# Patient Record
Sex: Male | Born: 1970 | Race: White | Marital: Single | Smoking: Heavy tobacco smoker
Health system: Southern US, Community
[De-identification: ages and names within clinical notes are randomized; demographics above are authoritative.]

## PROBLEM LIST (undated history)

## (undated) HISTORY — PX: HERNIA REPAIR: SHX51

---

## 2016-05-30 ENCOUNTER — Emergency Department: Payer: Self-pay

## 2016-05-30 ENCOUNTER — Emergency Department
Admission: EM | Admit: 2016-05-30 | Discharge: 2016-05-30 | Disposition: A | Discharge status: Home / Self Care | Payer: Self-pay | Attending: Emergency Medicine | Admitting: Emergency Medicine

## 2016-05-30 ENCOUNTER — Other Ambulatory Visit: Payer: Self-pay | Admitting: Emergency Medicine

## 2016-05-30 ENCOUNTER — Ambulatory Visit
Admission: RE | Admit: 2016-05-30 | Discharge: 2016-05-30 | Disposition: A | Discharge status: Home / Self Care | Payer: Self-pay | Source: Ambulatory Visit | Attending: Emergency Medicine | Admitting: Emergency Medicine

## 2016-05-30 DIAGNOSIS — R1011 Right upper quadrant pain: Secondary | ICD-10-CM | POA: Insufficient documentation

## 2016-05-30 DIAGNOSIS — R1084 Generalized abdominal pain: Secondary | ICD-10-CM

## 2016-05-30 DIAGNOSIS — R110 Nausea: Secondary | ICD-10-CM | POA: Insufficient documentation

## 2016-05-30 DIAGNOSIS — F17210 Nicotine dependence, cigarettes, uncomplicated: Secondary | ICD-10-CM | POA: Insufficient documentation

## 2016-05-30 LAB — COMPREHENSIVE METABOLIC PANEL
ALT: 13 U/L — ABNORMAL LOW (ref 17–63)
ANION GAP: 6 (ref 5–15)
AST: 20 U/L (ref 15–41)
Albumin: 4.5 g/dL (ref 3.5–5.0)
Alkaline Phosphatase: 33 U/L — ABNORMAL LOW (ref 38–126)
BILIRUBIN TOTAL: 0.5 mg/dL (ref 0.3–1.2)
BUN: 19 mg/dL (ref 6–20)
CO2: 26 mmol/L (ref 22–32)
Calcium: 8.7 mg/dL — ABNORMAL LOW (ref 8.9–10.3)
Chloride: 105 mmol/L (ref 101–111)
Creatinine, Ser: 1.14 mg/dL (ref 0.61–1.24)
Glucose, Bld: 102 mg/dL — ABNORMAL HIGH (ref 65–99)
POTASSIUM: 4.2 mmol/L (ref 3.5–5.1)
Sodium: 137 mmol/L (ref 135–145)
Total Protein: 6.8 g/dL (ref 6.5–8.1)

## 2016-05-30 LAB — CBC
HEMATOCRIT: 40.3 % (ref 40.0–52.0)
HEMOGLOBIN: 13.8 g/dL (ref 13.0–18.0)
MCH: 32.3 pg (ref 26.0–34.0)
MCHC: 34.2 g/dL (ref 32.0–36.0)
MCV: 94.6 fL (ref 80.0–100.0)
Platelets: 186 10*3/uL (ref 150–440)
RBC: 4.27 MIL/uL — ABNORMAL LOW (ref 4.40–5.90)
RDW: 13 % (ref 11.5–14.5)
WBC: 6.7 10*3/uL (ref 3.8–10.6)

## 2016-05-30 LAB — URINALYSIS, COMPLETE (UACMP) WITH MICROSCOPIC
Bacteria, UA: NONE SEEN
Bilirubin Urine: NEGATIVE
Glucose, UA: NEGATIVE mg/dL
Hgb urine dipstick: NEGATIVE
KETONES UR: NEGATIVE mg/dL
Leukocytes, UA: NEGATIVE
Nitrite: NEGATIVE
PROTEIN: NEGATIVE mg/dL
RBC / HPF: NONE SEEN RBC/hpf (ref 0–5)
Specific Gravity, Urine: 1.013 (ref 1.005–1.030)
Squamous Epithelial / LPF: NONE SEEN
WBC UA: NONE SEEN WBC/hpf (ref 0–5)
pH: 6 (ref 5.0–8.0)

## 2016-05-30 LAB — LIPASE, BLOOD: Lipase: 22 U/L (ref 11–51)

## 2016-05-30 MED ORDER — MORPHINE SULFATE (PF) 4 MG/ML IV SOLN
INTRAVENOUS | Status: AC
  Administered 2016-05-30: 4 mg via INTRAVENOUS
  Filled 2016-05-30: qty 1

## 2016-05-30 MED ORDER — IOPAMIDOL (ISOVUE-300) INJECTION 61%: 30.0000 mL | Freq: Once | INTRAVENOUS | Status: DC | PRN

## 2016-05-30 MED ORDER — GI COCKTAIL ~~LOC~~: 30.0000 mL | Freq: Once | ORAL | Status: DC

## 2016-05-30 MED ORDER — ONDANSETRON HCL 4 MG/2ML IJ SOLN
4.0000 mg | Freq: Once | INTRAMUSCULAR | Status: AC | PRN
  Administered 2016-05-30: 4 mg via INTRAVENOUS
  Filled 2016-05-30: qty 2

## 2016-05-30 MED ORDER — SODIUM CHLORIDE 0.9 % IV BOLUS (SEPSIS)
1000.0000 mL | Freq: Once | INTRAVENOUS | Status: AC
  Administered 2016-05-30: 1000 mL via INTRAVENOUS

## 2016-05-30 MED ORDER — MORPHINE SULFATE (PF) 4 MG/ML IV SOLN
4.0000 mg | Freq: Once | INTRAVENOUS | Status: AC
  Administered 2016-05-30: 4 mg via INTRAVENOUS

## 2016-05-30 MED ORDER — MORPHINE SULFATE (PF) 4 MG/ML IV SOLN
4.0000 mg | Freq: Once | INTRAVENOUS | Status: AC
  Administered 2016-05-30: 4 mg via INTRAVENOUS
  Filled 2016-05-30: qty 1

## 2016-05-30 NOTE — ED Triage Notes (Signed)
Pt presents c/o RUQ pain starting this am. + nausea.

## 2016-05-30 NOTE — ED Provider Notes (Signed)
[facility] Emergency Department Provider Note ____________________________________________   I have reviewed the triage vital signs and the triage nursing note.  HISTORY  Chief Complaint Abdominal Pain   Historian Patient  HPI Alvin Gentry is a 46 y.o. male presents with moderate to severe right upper quadrant pain with nausea but no vomiting. No lower abdominal pain. History of bilateral inguinal hernia repair, but no other intra-abdominal surgeries. No constipation or diarrhea. Never has had this pain before pain is essentially constant for several hours now since early this morning.  Chest pain or trouble breathing.    History reviewed. No pertinent past medical history.  There are no active problems to display for this patient.   Past Surgical History:  Procedure Laterality Date  . HERNIA REPAIR     groin    Prior to Admission medications   Not on File    Allergies  Allergen Reactions  . Asa [Aspirin] Anaphylaxis  . Tetracyclines & Related     History reviewed. No pertinent family history.  Social History Social History  Substance Use Topics  . Smoking status: Heavy Tobacco Smoker    Packs/day: 1.00    Types: Cigarettes  . Smokeless tobacco: Never Used  . Alcohol use No    Review of Systems  Constitutional: Negative for fever. Eyes: Negative for visual changes. ENT: Negative for sore throat. Cardiovascular: Negative for chest pain. Respiratory: Negative for shortness of breath. Gastrointestinal: As per history of present illness.. Genitourinary: Negative for dysuria. Musculoskeletal: Negative for back pain. Skin: Negative for rash. Neurological: Negative for headache. 10 point Review of Systems otherwise negative ____________________________________________   PHYSICAL EXAM:  VITAL SIGNS: ED Triage Vitals  Enc Vitals Group     BP 05/30/16 1009 (!) 123/109     Pulse Rate 05/30/16 1009 89     Resp 05/30/16 1009 16      Temp 05/30/16 1009 98 F (36.7 C)     Temp Source 05/30/16 1009 Oral     SpO2 05/30/16 1009 99 %     Weight 05/30/16 1010 165 lb (74.8 kg)     Height 05/30/16 1010  (1.905 m)     Head Circumference --      Peak Flow --      Pain Score 05/30/16 1009 8     Pain Loc --      Pain Edu? --      Excl. in GC? --      Constitutional: Alert and oriented. Holding right upper quadrant with hand. HEENT   Head: Normocephalic and atraumatic.      Eyes: Conjunctivae are normal. PERRL. Normal extraocular movements.      Ears:         Nose: No congestion/rhinnorhea.   Mouth/Throat: Mucous membranes are moist.   Neck: No stridor. Cardiovascular/Chest: Normal rate, regular rhythm.  No murmurs, rubs, or gallops. Respiratory: Normal respiratory effort without tachypnea nor retractions. Breath sounds are clear and equal bilaterally. No wheezes/rales/rhonchi. Gastrointestinal: Soft. No distention, no guarding, no rebound. Moderate tenderness in right upper quadrant.  No lower abdominal tenderness. Genitourinary/rectal:Deferred Musculoskeletal: Nontender with normal range of motion in all extremities. No joint effusions.  No lower extremity tenderness.  No edema. Neurologic:  Normal speech and language. No gross or focal neurologic deficits are appreciated. Skin:  Skin is warm, dry and intact. No rash noted. Psychiatric: Mood and affect are normal. Speech and behavior are normal. Patient exhibits appropriate insight and judgment.   ____________________________________________  LABS (  pertinent positives/negatives)  Labs Reviewed  COMPREHENSIVE METABOLIC PANEL - Abnormal; Notable for the following:       Result Value   Glucose, Bld 102 (*)    Calcium 8.7 (*)    ALT 13 (*)    Alkaline Phosphatase 33 (*)    All other components within normal limits  CBC - Abnormal; Notable for the following:    RBC 4.27 (*)    All other components within normal limits  URINALYSIS, COMPLETE  (UACMP) WITH MICROSCOPIC - Abnormal; Notable for the following:    Color, Urine YELLOW (*)    APPearance CLEAR (*)    All other components within normal limits  LIPASE, BLOOD    ____________________________________________    EKG I, Governor Rooks, MD, the attending physician have personally viewed and interpreted all ECGs.  79 bpm. Normal sinus rhythm. Narrow QRS. Normal axis. Normal ST and T-wave ____________________________________________  RADIOLOGY All Xrays were viewed by me. Imaging interpreted by Radiologist.  RUQ ultrasound: No acute findings in the right upper quadrant ultrasound.  Chest x-ray two-view : Patient left before completed  CT abdomen and pelvis with contrast: Not completed __________________________________________  PROCEDURES  Procedure(s) performed: None  Critical Care performed: None  ____________________________________________   ED COURSE / ASSESSMENT AND PLAN  Pertinent labs & imaging results that were available during my care of the patient were reviewed by me and considered in my medical decision making (see chart for details).   Patient arrives in significant right upper quadrant pain. Afebrile with stable vital signs other than slightly elevated diastolic.  No acute abdomen, but he is tender in the right upper quadrant. Patient started on symptomatic morphine and Zofran as well as IV fluid bolus. Labs sent and patient ordered to have imaging of the right upper quadrant ultrasound.  Orders are reassuring. Laboratory studies are reassuring. Urinalysis without blood.  I am going out on a chest x-ray to ensure right upper quadrant pain is not from low lying pulmonary source. I am going to check CT abdomen and pelvis to ensure right-sided pain is not from diverticulitis or posterior/retro-cecal appendix.  Did require second dosing of pain medication.  I am also trying gi cocktail for possible gastritis source of discomfort.   Patient  abruptly needed to leave due to family emergency. I had a cardiac discussed with him, and he left AMA before discharge paperwork completed.    CONSULTATIONS:   None Patient / Family / Caregiver informed of clinical course, medical decision-making process, and agree with plan.   I discussed return precautions, follow-up instructions, and discharge instructions with patient and/or family.  Discharge instructions:  You were evaluated for right sided abdominal pain and although no certain cause was found your exam and evaluation are reassuring in the emergency department today.  You chose to leave before completing evaluation. Return to emergency room immediately for any worsening abdominal pain, vomiting blood, black or bloody stools, dizziness, passing out, or any other symptoms concerning to you. ___________________________________________   FINAL CLINICAL IMPRESSION(S) / ED DIAGNOSES   Final diagnoses:  RUQ pain              Note: This dictation was prepared with Dragon dictation. Any transcriptional errors that result from this process are unintentional    Governor Rooks, MD 05/30/16 1341

## 2016-05-30 NOTE — Discharge Instructions (Signed)
You were evaluated for right sided abdominal pain and although no certain cause was found your exam and evaluation are reassuring in the emergency department today.  You chose to leave before completing evaluation. Return to emergency room immediately for any worsening abdominal pain, vomiting blood, black or bloody stools, dizziness, passing out, or any other symptoms concerning to you.

## 2016-05-30 NOTE — ED Notes (Signed)
Pt states he has to go due to family emergency while waiting in the ER. MD aware

## 2016-05-30 NOTE — ED Notes (Signed)
FIRST NURSE NOTE: RUQ abdominal pain that started this morning.

## 2017-05-18 IMAGING — US US ABDOMEN LIMITED
1 series · 14 of 25 positions shown · non-contrast
Comparison: None.

CLINICAL DATA: RIGHT upper quadrant pain for 5 hours

EXAM:
US ABDOMEN LIMITED - RIGHT UPPER QUADRANT

[Series 1: us abdomen limited · 0.25mm/px · 14 of 44 slices shown]
[im 1/44]
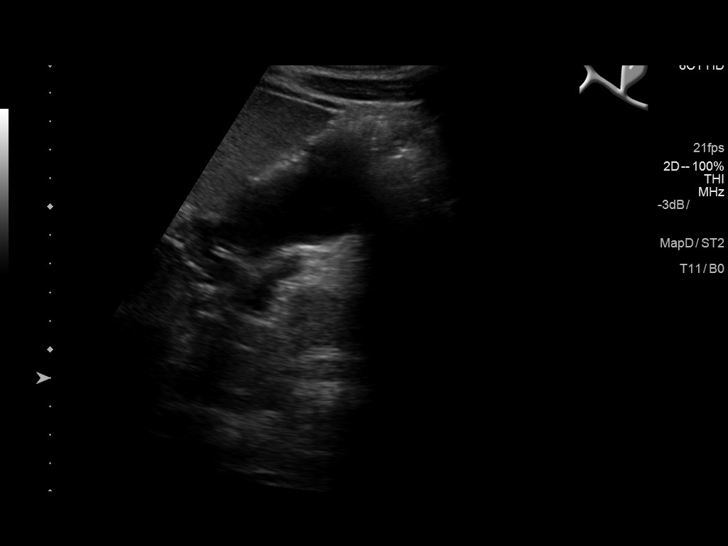
[im 4/44]
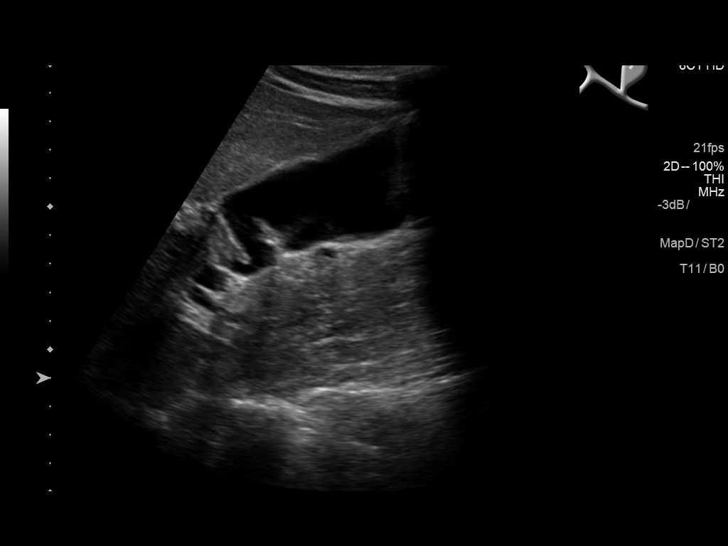
[im 8/44]
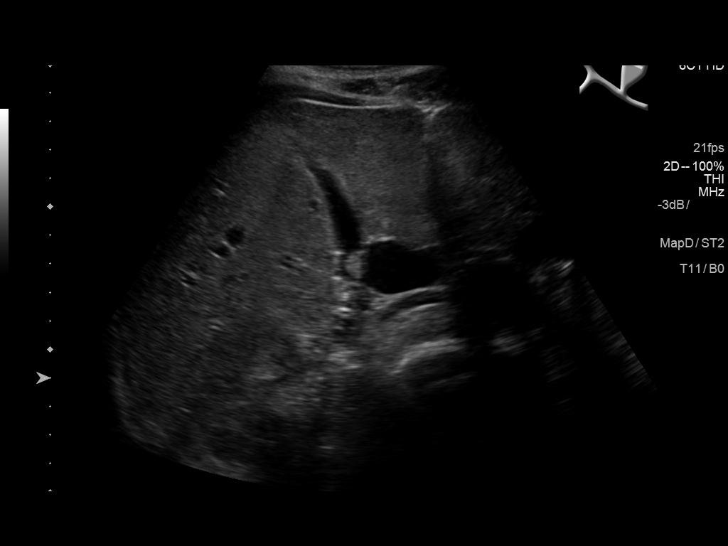
[im 11/44]
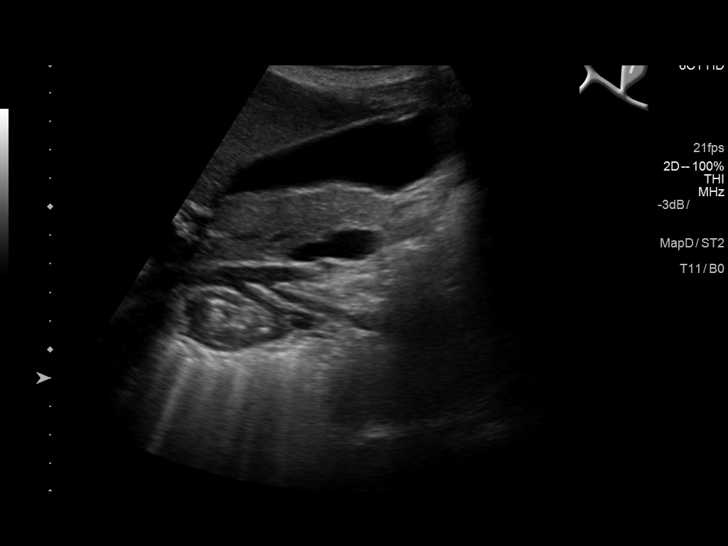
[im 15/44]
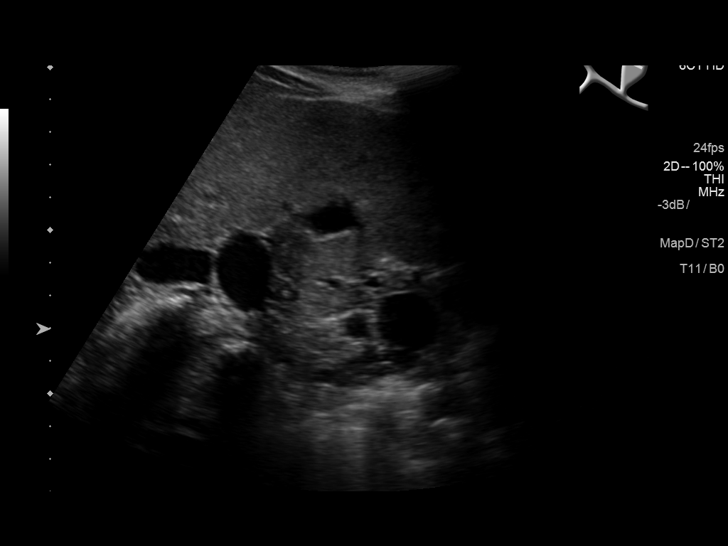
[im 17/44]
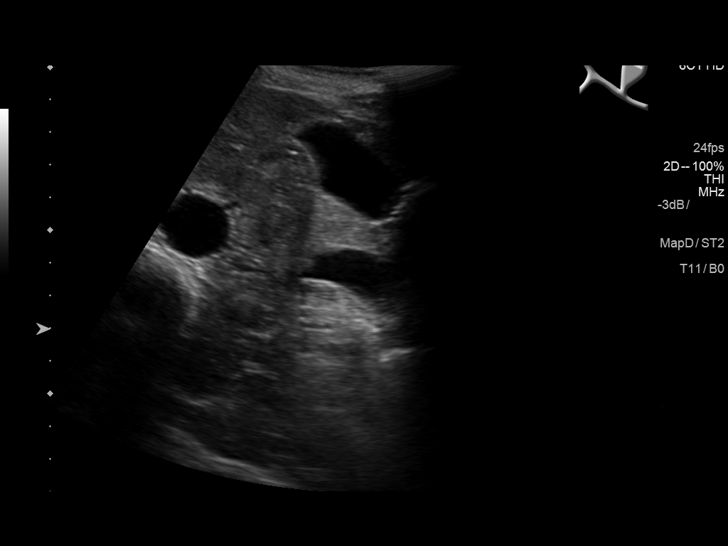
[im 20/44]
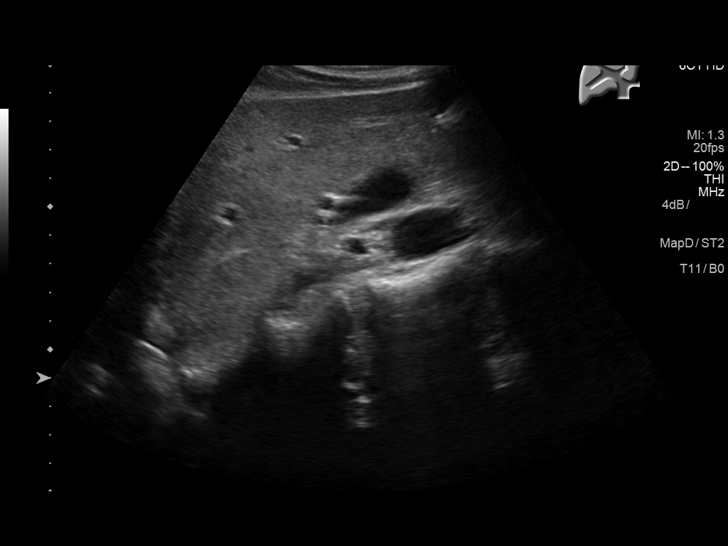
[im 24/44]
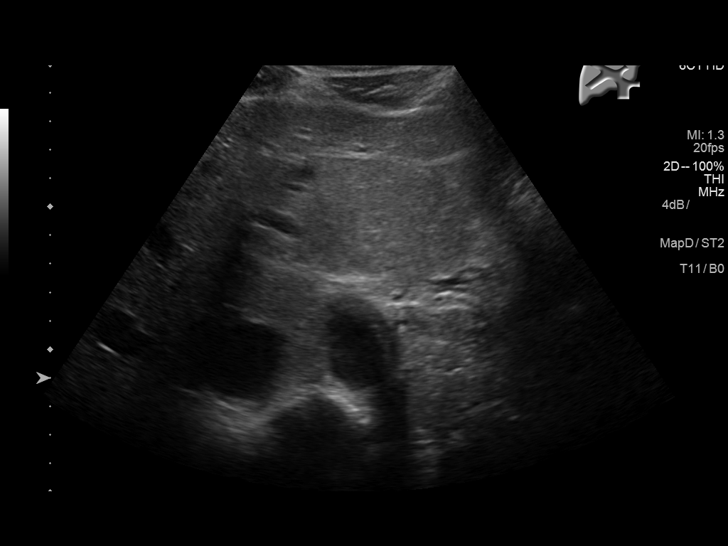
[im 27/44]
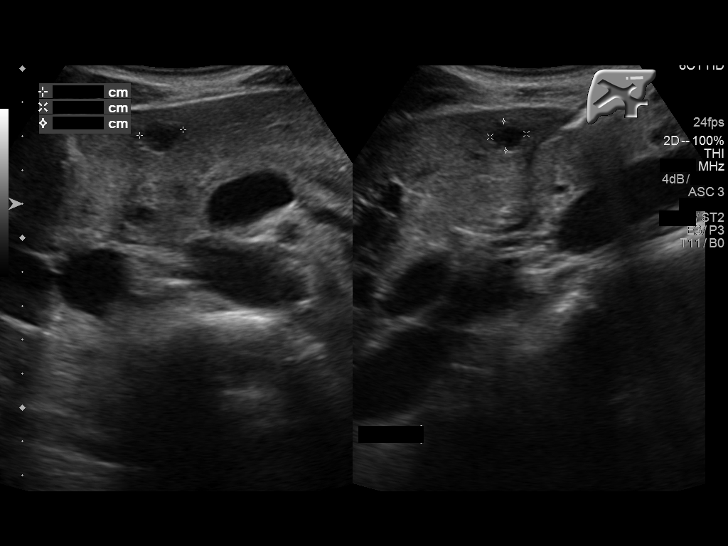
[im 29/44]
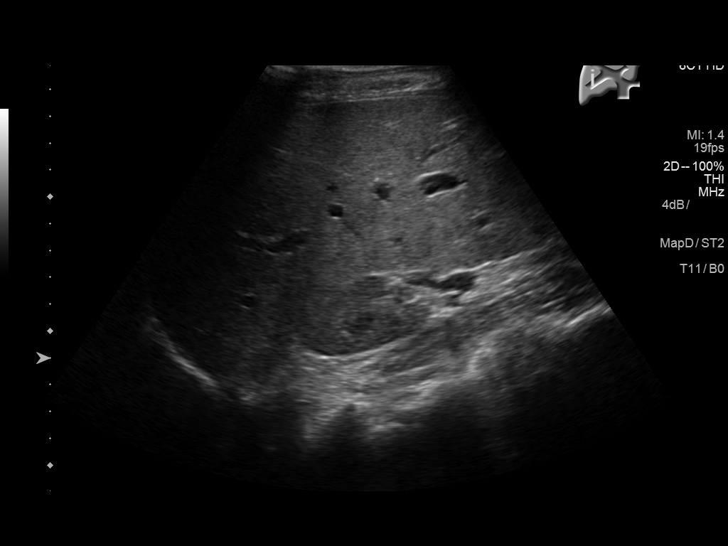
[im 33/44]
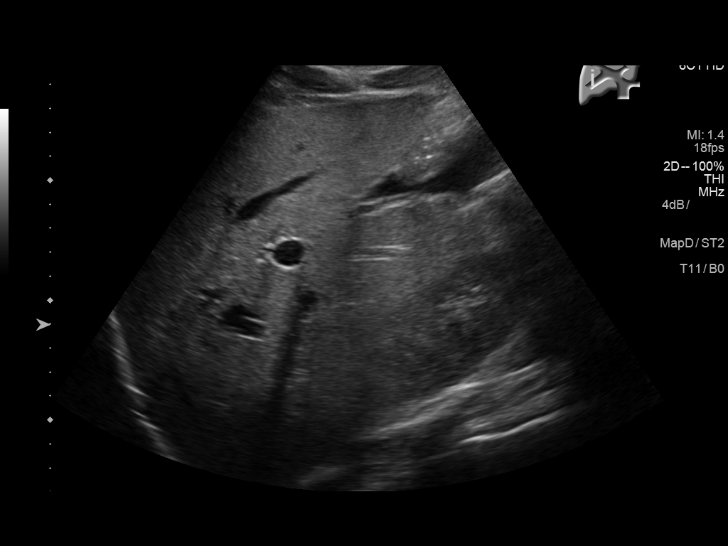
[im 36/44]
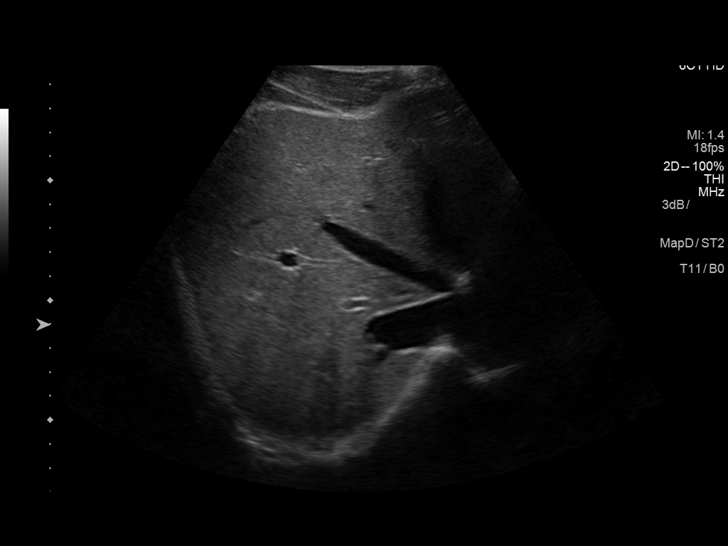
[im 40/44]
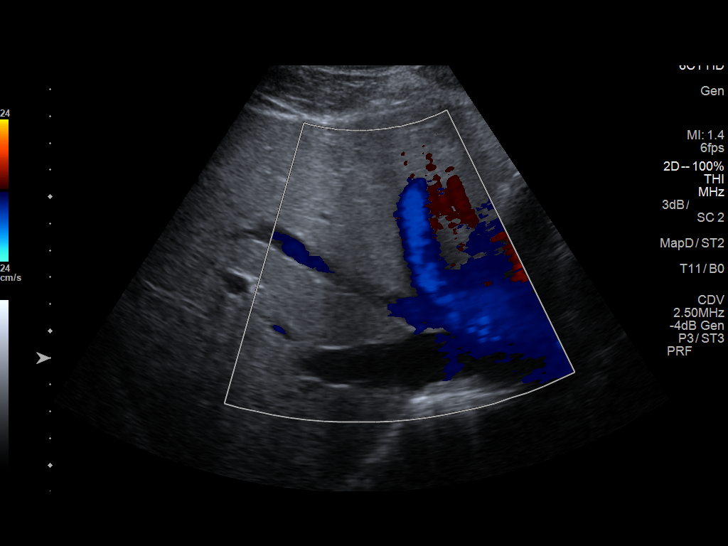
[im 44/44]
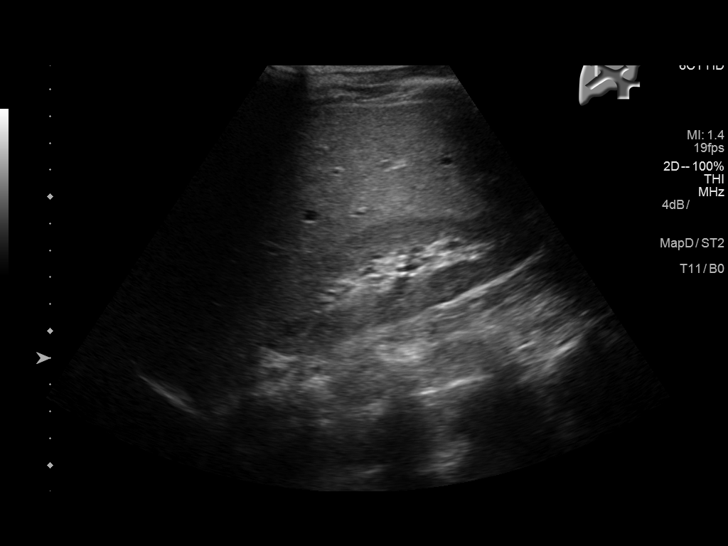

[14 of 25 positions shown; findings below may reference images not displayed]

FINDINGS: Gallbladder:

No gallstones or wall thickening visualized. No sonographic Murphy
sign noted by sonographer.

Common bile duct:

Diameter: 2 mm

Liver:

Benign 1.3 cm anechoic cyst. Within normal limits in parenchymal
echogenicity.
IMPRESSION: No acute findings in the RIGHT upper quadrant ultrasound.

## 2018-01-30 IMAGING — CR DG CHEST 2V
2 series · 2 of 2 positions shown · non-contrast
Comparison: None.

CLINICAL DATA: Pt came in with Epigastric pain and left sided
abdominal pain. No previous heart or lung problems. Smoker.

EXAM:
CHEST  2 VIEW

[chest pa]
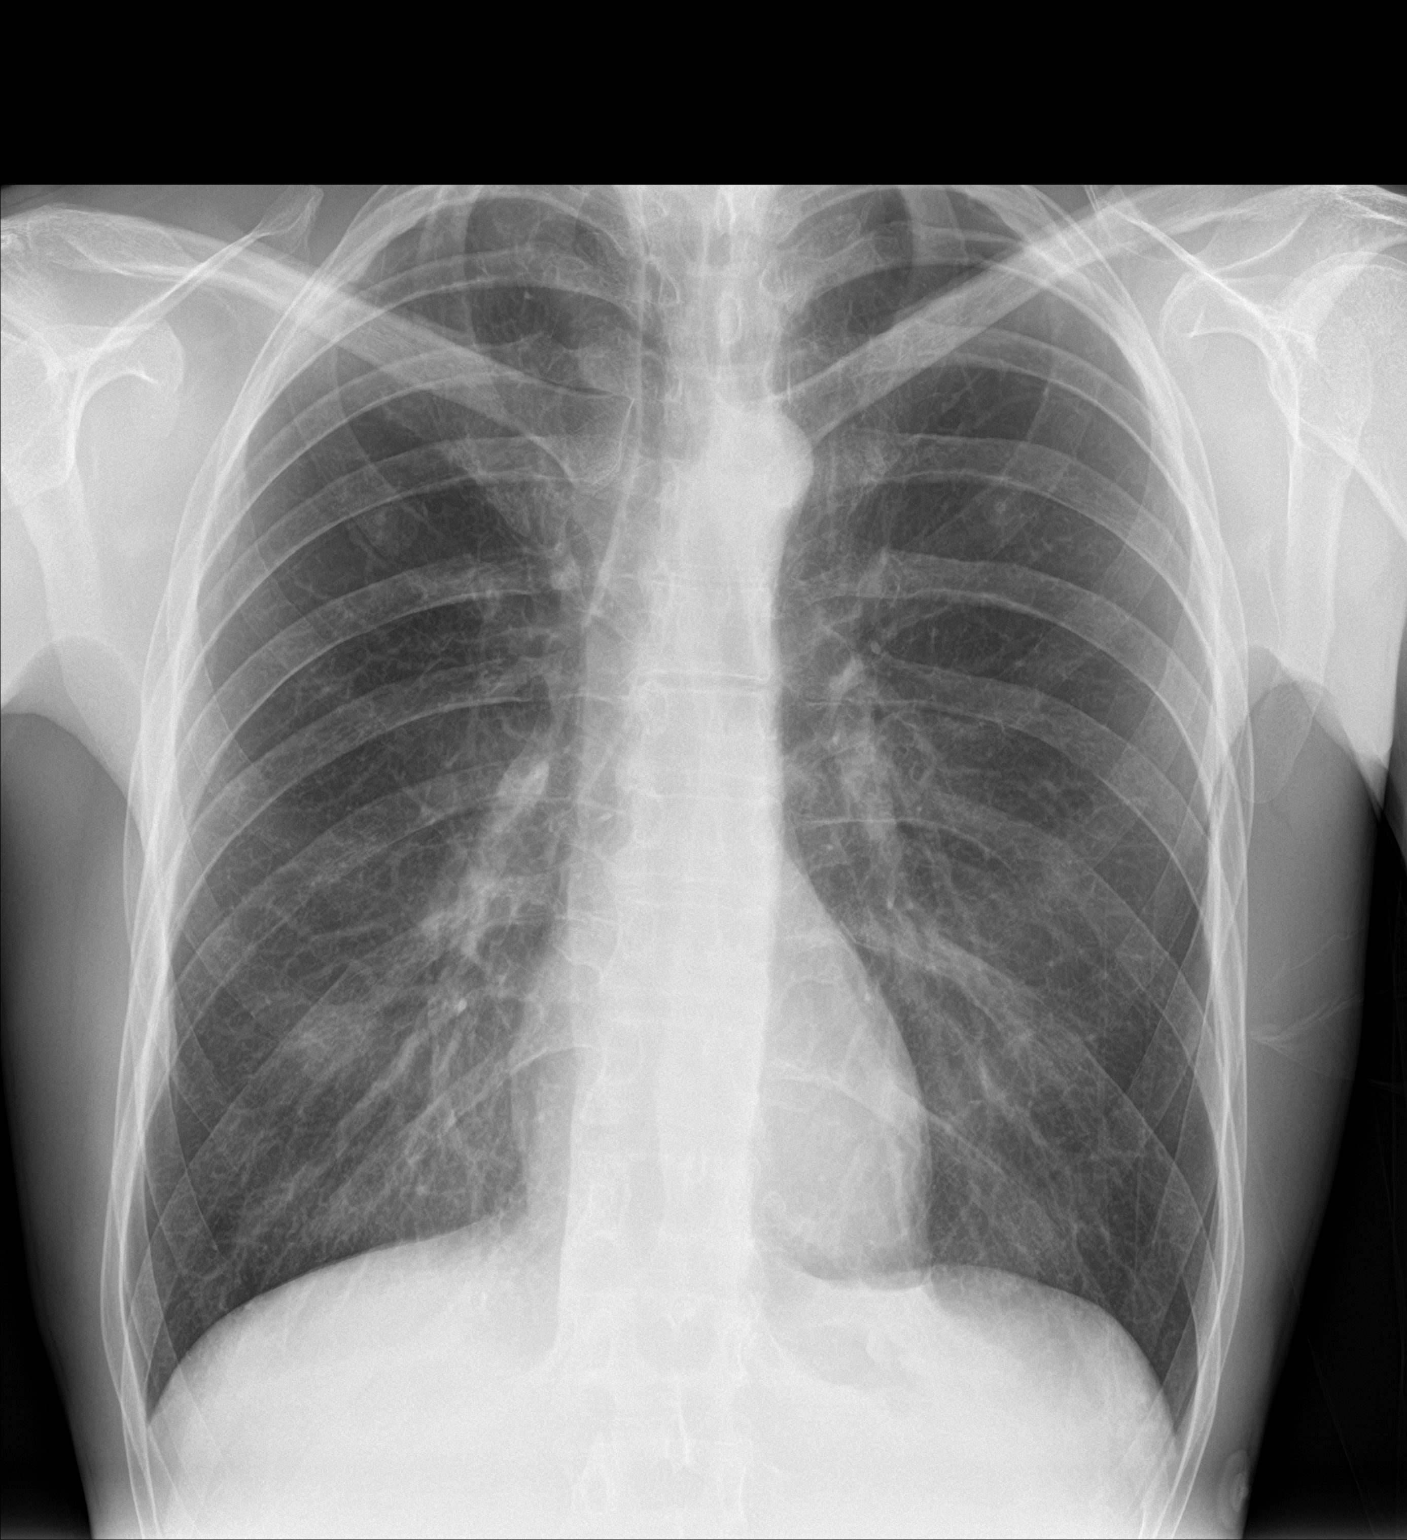

[chest lat]
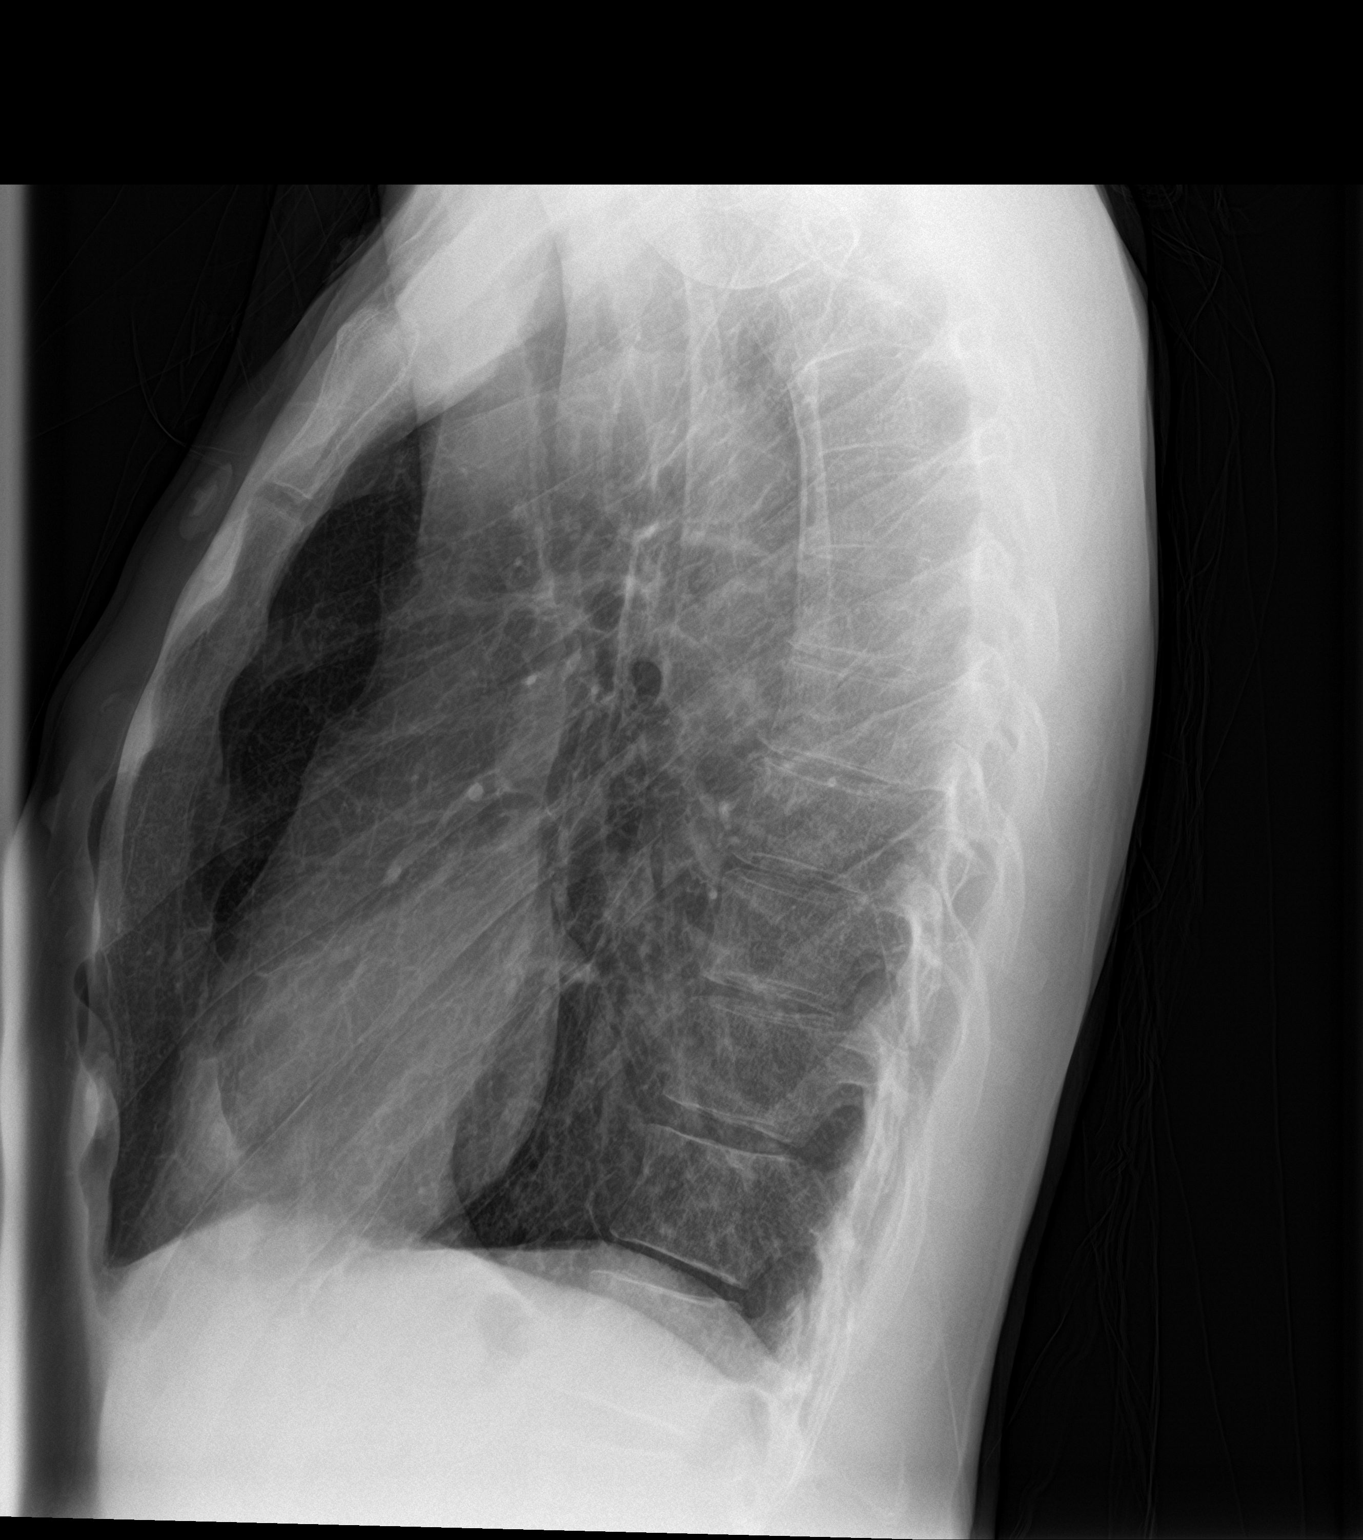

[2 of 2 positions shown; findings below may reference images not displayed]

FINDINGS: Normal mediastinum and cardiac silhouette. Normal pulmonary
vasculature. No evidence of effusion, infiltrate, or pneumothorax.
No acute bony abnormality.
IMPRESSION: No acute cardiopulmonary process.
